# Patient Record
Sex: Male | Born: 1990 | Race: Black or African American | Hispanic: No | Marital: Single | Smoking: Current every day smoker
Health system: Southern US, Community
[De-identification: ages and names within clinical notes are randomized; demographics above are authoritative.]

## PROBLEM LIST (undated history)

## (undated) HISTORY — PX: ELBOW SURGERY: SHX618

---
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
## 2010-06-17 NOTE — Op Note (Signed)
Trevor Bradford, Trevor Bradford             ACCOUNT NO.:  [id number]   MEDICAL RECORD NO.:  [id number]          PATIENT TYPE:  AMB   LOCATION:  DAY                          FACILITY:  [facility]   PHYSICIAN:  Trevor Bradford, M.D.  DATE OF BIRTH:  Feb 16, 1990   DATE OF PROCEDURE:  10/19/2007  DATE OF DISCHARGE:  10/19/2007                               OPERATIVE REPORT   PREOPERATIVE DIAGNOSIS:  History of reduced right testicular torsion.   POSTOPERATIVE DIAGNOSIS:  History of reduced right testicular torsion.   PROCEDURE:  Bilateral scrotal orchiopexy.   ATTENDING PHYSICIAN:  Trevor Bradford, M.D.   RESIDENT PHYSICIAN:  Dr. Isabell Jarvis E. Trevor Bradford, M.D. and Trevor Chimes,  RN.   ANESTHESIA:  General plus local.   INDICATIONS FOR PROCEDURE:  Trevor Bradford is a 20 year old African American  male who was taken to an emergency department recently with complaint of  sudden onset of right testicular discomfort associated with nausea.  He  was found to have a firm, tender right testicle that appears as if it  was reduced or detorsion by an emergency department physician.  Doppler  ultrasound  demonstrated flow to the right testicle.  He was sent to Dr.  Isabel Bradford for further evaluation.  The risks, consequences, concerns and  complications were discussed with the patient and his family  preoperatively and informed consent was obtained.   PROCEDURE IN DETAIL:  The patient was brought to the operating room and  placed in the supine position.  He was correctly identified by his  wristband.  Appropriate time-out was taken.  IV antibiotics were  administered.  General anesthesia was delivered.  Once adequately  anesthetized, his groin was clipped, prepped and draped in normal  sterile fashion.  We palpated both testicles.  They were palpably  normal.  We then made a small midline incision through his scrotum  sharply and used blunt and sharp dissection through the fascial layers  and subsequently delivered the  patient's left testicle into the  operative field.  It was grossly normal in appearance.  A spermatic cord  block was placed using 0.25% plain Marcaine.  The left testicular  appendage was amputated with Bovie electrocautery.  There was an  associated bell clapper deformity appreciated, however.  We then used a  4-0 Ethibond suture to fixate his left testicle in the three-point  configuration medially, laterally and inferiorly to the dartos tissue.  As this was done proper lie and orientation of the testicle was verified  and in this included protection of the epididymis and cord structures.  We then turned our attention to his right side.  We again used blunt and  sharp dissection through the fascial layers to expose the right  testicle.  The right testicle was delivered into the field.  Again, the  testicle and epididymis appeared grossly normal, but bell clapper  deformity was noticed here as well.  A spermatic cord block was placed  using 0.25% plain Marcaine.  The right testicular appendage was  amputated with Bovie electrocautery.  We then put medial and lateral  sutures using 4-0 Ethibond in through  the mesorchium and fixating it to  the dartos fascia as we delivered the testicle right back into the  hemiscrotum.  We also put inferior suture in after the testicle was then  replaced back into the hemiscrotum.  We then closed the both hemi-scrota  by reapproximating the right side to the midline raphe to the left side  in a running fashion using 3-0 Vicryl.  The skin was then closed in  simple running fashion using 4-0 Vicryl.  The wound was re-cleaned and  re-dressed.  Palpation verified proper orientation of the testes.  We  then placed bacitracin over the incision and applied benzoin and a  Tegaderm.  This marked the end of our procedure.  He tolerated the  procedure well.  He awoke from anesthesia and was taken to the recovery  room in stable condition.  There were no  complications.  Trevor Bradford was  present and participated in all aspects of the case.     ______________________________  Trevor Bradford, M.D.      Trevor Bradford, M.D.  Electronically Signed    DEW/MEDQ  D:  10/19/2007  T:  10/20/2007  Job:  [id number]

## 2010-06-20 NOTE — Op Note (Signed)
NAMENOLLIE, TERLIZZI NO.:  [id number]   MEDICAL RECORD NO.:  [id number]          PATIENT TYPE:  OBV   LOCATION:  6120                         FACILITY:  [facility]   PHYSICIAN:  Dionne Ano. Gramig III, M.D.DATE OF BIRTH:  Jul 01, 1990   DATE OF PROCEDURE:  05/31/2004  DATE OF DISCHARGE:                                 OPERATIVE REPORT   PREOPERATIVE DIAGNOSIS:  Fracture dislocation, left elbow, after a wrestling  injury irreducible at present time, with medial epicondylar fragment  entrapped in the joint along with the ulnar nerve.   POSTOPERATIVE DIAGNOSIS:  Fracture dislocation, left elbow, after a  wrestling injury irreducible at present time, with medial epicondylar  fragment entrapped in the joint along with the ulnar nerve.   PROCEDURE:  1.  Open reduction and fixation, left elbow fracture dislocation.  This was      a medial epicondylar fracture ORIF and reduction of an irreducible      fracture dislocation.  2.  Stress radiography.  3.  Lateral ulnar collateral ligament repair back to the lateral aspect of      the elbow through a separate incision.  4.  Ulnar nerve decompression, left elbow, about the cubital tunnel.   SURGEON:  Dionne Ano. Amanda Pea, MD.   ASSISTANT:  Karie Chimera, PA-C.   COMPLICATIONS:  None.   ANESTHESIA:  General.   INDICATIONS FOR PROCEDURE:  This patient is a 20 year old male, who was  injured in a wrestling event today at approximately 3:30.  He was seen at  [facility].  The patient requested to come to [facility].  The ER staff talked with my partner, Dr. Ollen Gross, who asked me to take  over and preside over the care of this patient.  I discussed the care with  Dr. Lequita Halt as well as the ER staff at [facility] and immediately  arranged for a transfer to our facility.  The patient was transferred and  once in the [facility] Emergency Room I reviewed plain film radiographs and  obtained a CT scan to  document my suspicion of a medial epicondylar fracture  fragment entrapped in the joint with fracture dislocation of the elbow  irreducible in nature.  The patient was prepped for surgery immediately and  was taken to the operative suite.  Informed consent was obtained from the  family.  I discussed with them the risks of bleeding, infection, anesthesia,  neurovascular injury, stiffness, arthrofibrosis, dystrophy, and other  complicating features associated with elbow fracture dislocations including  heterotopic ossification and compartment syndrome with subsequent loss of  limb, etc.  I have discussed all issues at length and with this in mind they  decided to proceed.   OPERATIVE PROCEDURE:  This patient was taken to the operative suite and  underwent smooth induction of anesthesia.  He was given preoperative Ancef.  Following this, he underwent a Hibiclens scrub due to a SHELLFISH ALLERGY.  He tolerated the Ancef well and there were no complicating features.  He  weighs 110 pounds.  The patient was laid supine, appropriately padded, and  prepped and  draped in the usual sterile fashion.  I performed a gentle  manipulative reduction in hopes to dislodge the medial epicondylar fragment.  This was done by placing a valgus stress on the elbow and extending the  wrist in hopes to dislodge the medial epicondylar fragment.  This was  unsuccessful and I immediately inflated the tourniquet to 250 mmHg and made  an incision medially.  This was made over the medial epicondylar region.  Dissection was carried down and the medial antebrachial cutaneous nerve  branch was identified and protected at all times.  It was very obvious he  had a medial epicondylar fragment entrapped in the joint with a flexor  pronator mass.  The patient had a posterolateral dislocation associated with  this.  I identified the ulnar nerve, kept it very protected and following  this I dug portions of the flexor pronator mass  and medial epicondylar  fragment out of the joint.  I irrigated copiously and suctioned.  I then  performed an ulnar nerve release about the area in question.  The ulnar  nerve was released, a vessel loop was placed around it, and this included  the medial and muscular septum, cubital tunnel, and Osborne's ligament.  The  two heads of the FCU were also split.  The nerve was very gently handled at  all times and protected in a direct 4.0 loop magnification.  Once this was  done and I was able to get the medial epicondylar fragment out, it was quite  obvious that the patient had some enfolded lateral tissue including portions  of the brachialis and annular complex.  This was very unusual, but not  totally unsurprising given the very irreducible nature of the fracture  dislocation.  I did try, with gentle attempts, reduction once the medial  epicondylar fragment was dislodged from the joint.  However, due to the  interpose tissue laterally, this was unsuccessful.  I thus made an incision  laterally.  This was a posterolateral incision.  Dissection was carried  down, the anconeus was split, the lateral and collateral ligament injury was  noted, and I then kept the arm in pronation and gently was able to pull the  interpose muscular tissue away from the radial head.  This was primarily  brachial radialis tissue.  Portions of the capsule were also enfolded in the  joint.  I carefully released this, taking care to avoid neurovascular  structures, and then was able to reduce the elbow fracture dislocation.  Once this was reduced, I then turned attention towards the ulnar nerve, I  made sure it was completely released, and I left it in situ and protected  it.  At this point in time, I then performed a medial epicondyle ORIF with  0.062 K-wire, 0.045 K-wire, and two FiberWire sutures about a very thick  periosteal layer.  The patient tolerated this well.  I checked this under radiograph.  I was  pleased with the reduction, the position of the medial  epicondylar fragment, and per palpation, this appeared to be stable.  I bent  the pins at 90 degrees to protect the area and then placed them a bit more  further inward against the bone for later removal.  This will prevent  migration of the pins.  I bent this away from the ulnar nerve, of course, at  90 degrees.  Following ORIF of the medial epicondylar region and ulnar nerve  decompression, I then turned attention back towards the lateral side  and  performed a lateral ulnar collateral ligament repair with 4-0 FiberWire  suture.  This was gently tacked back to its previous position, and utilizing  periosteal tissue I repaired this ligament.  I was pleased with this.  I did  make a split in the anconeus for swelling purposes.  At this time, I  deflated the tourniquet at less than an hour and a half and irrigated  copiously about the joint and both incisions.  Radial pulse was excellent  and there was no evidence of compartment syndrome.  The compartments were  soft, he had excellent refill in the fingers, good pulse, and all went quite  well.  I then closed the subcu with Vicryl followed by a staple gun at the  skin edge.  The patient tolerated this well and there were no complicating  features.  He will be taken back to the operative suite in four to six weeks  for removal of the pins and evaluation.  I have discussed all issues with  his family.  I should note the ulnar nerve decompression was a distinct and  separate portion of the case outside of the general exposure due to its  entrapment in the joint.  The patient tolerated the procedure well.  He will  be monitored in the recovery room and then placed on the Pediatric floor.  We will continue IV antibiotics, and pain medicine according to his needs,  and general postop measures.  All questions have been encouraged and  answered.  At multiple points in the operative procedure,  stress radiography  was performed indicating correct placement of the pins and satisfactory  ORIF.  I was pleased with this and the reduction.  All looked very nice and  congruent.     WMG/MEDQ  D:  06/01/2004  T:  06/01/2004  Job:  [id number]   cc:   Attn:  Emergency Room staff [facility]

## 2010-11-05 LAB — GC/CHLAMYDIA PROBE AMP, GENITAL: GC Probe Amp, Genital: NEGATIVE

## 2015-05-05 ENCOUNTER — Emergency Department (INDEPENDENT_AMBULATORY_CARE_PROVIDER_SITE_OTHER)
Admission: EM | Admit: 2015-05-05 | Discharge: 2015-05-05 | Disposition: A | Discharge status: Home / Self Care | Payer: Federal, State, Local not specified - PPO | Source: Home / Self Care | Attending: Emergency Medicine | Admitting: Emergency Medicine

## 2015-05-05 ENCOUNTER — Encounter (HOSPITAL_COMMUNITY): Payer: Self-pay | Admitting: *Deleted

## 2015-05-05 DIAGNOSIS — R040 Epistaxis: Secondary | ICD-10-CM | POA: Diagnosis not present

## 2015-05-05 NOTE — ED Notes (Signed)
Assessment per F. Luisa HartPatrick, [place].  No active nosebleed.

## 2015-05-05 NOTE — ED Provider Notes (Signed)
CSN: [id number]     Arrival date & time 05/05/15  1825 History   First MD Initiated Contact with Patient 05/05/15 1841     Chief Complaint  Patient presents with  . Epistaxis   (Consider location/radiation/quality/duration/timing/severity/associated sxs/prior Treatment) HPI Bleeding from left nostril today, lasted about 15 minutes, rubbing nose when this started. No headache, or other symptoms. Similar nosebleed several months ago. No signigicant PMHx.  History reviewed. No pertinent past medical history. History reviewed. No pertinent past surgical history. No family history on file. Social History  Substance Use Topics  . Smoking status: None  . Smokeless tobacco: None  . Alcohol Use: None    Review of Systems Nose bleed  Allergies  Amoxicillin  Home Medications   Prior to Admission medications   Not on File   Meds Ordered and Administered this Visit  Medications - No data to display  BP 133/96 mmHg  Pulse 92  Temp(Src) 98.5 F (36.9 C) (Oral)  Resp 18  SpO2 100% No data found.   Physical Exam NURSES NOTES AND VITAL SIGNS REVIEWED. CONSTITUTIONAL: Well developed, well nourished, no acute distress HEENT: normocephalic, atraumatic, small dry area left kiesselbach plexus, no active bleeding EYES: Conjunctiva normal NECK:normal ROM, supple, no adenopathy PULMONARY:No respiratory distress, normal effort MUSCULOSKELETAL: Normal ROM of all extremities,  SKIN: warm and dry without rash PSYCHIATRIC: Mood and affect, behavior are normal  ED Course  Procedures (including critical care time)  Labs Review Labs Reviewed - No data to display  Imaging Review No results found.   Visual Acuity Review  Right Eye Distance:   Left Eye Distance:   Bilateral Distance:    Right Eye Near:   Left Eye Near:    Bilateral Near:         MDM   1. Bleeding nose     Patient is reassured that there are no issues that require transfer to higher level of care at this  time or additional tests. Patient is advised to continue home symptomatic treatment. Patient is advised that if there are new or worsening symptoms to attend the emergency department, contact primary care provider, or return to UC. Instructions of care provided discharged home in stable condition.    THIS NOTE WAS GENERATED USING A VOICE RECOGNITION SOFTWARE PROGRAM. ALL REASONABLE EFFORTS  WERE MADE TO PROOFREAD THIS DOCUMENT FOR ACCURACY.  I have verbally reviewed the discharge instructions with the patient. A printed AVS was given to the patient.  All questions were answered prior to discharge.      Tharon AquasFrank C Janai Brannigan, PA 05/05/15 [phone]

## 2015-05-05 NOTE — Discharge Instructions (Signed)

## 2015-05-07 ENCOUNTER — Encounter (HOSPITAL_COMMUNITY): Payer: Self-pay | Admitting: Emergency Medicine

## 2015-05-07 ENCOUNTER — Emergency Department (HOSPITAL_COMMUNITY)
Admission: EM | Admit: 2015-05-07 | Discharge: 2015-05-07 | Disposition: A | Discharge status: Home / Self Care | Payer: Federal, State, Local not specified - PPO | Attending: Emergency Medicine | Admitting: Emergency Medicine

## 2015-05-07 DIAGNOSIS — Z880 Allergy status to penicillin: Secondary | ICD-10-CM | POA: Insufficient documentation

## 2015-05-07 DIAGNOSIS — F17210 Nicotine dependence, cigarettes, uncomplicated: Secondary | ICD-10-CM | POA: Insufficient documentation

## 2015-05-07 DIAGNOSIS — J069 Acute upper respiratory infection, unspecified: Secondary | ICD-10-CM | POA: Diagnosis not present

## 2015-05-07 DIAGNOSIS — R05 Cough: Secondary | ICD-10-CM | POA: Diagnosis present

## 2015-05-07 DIAGNOSIS — R040 Epistaxis: Secondary | ICD-10-CM | POA: Insufficient documentation

## 2015-05-07 MED ORDER — BACITRACIN ZINC 500 UNIT/GM EX OINT: TOPICAL_OINTMENT | CUTANEOUS | Status: AC

## 2015-05-07 MED ORDER — GUAIFENESIN ER 600 MG PO TB12: 600.0000 mg | ORAL_TABLET | Freq: Two times a day (BID) | ORAL | Status: AC | PRN

## 2015-05-07 MED ORDER — OXYMETAZOLINE HCL 0.05 % NA SOLN: NASAL | Status: AC

## 2015-05-07 MED ORDER — BENZONATATE 100 MG PO CAPS
100.0000 mg | ORAL_CAPSULE | Freq: Three times a day (TID) | ORAL | Status: AC | PRN
Start: 2015-05-07 — End: ?

## 2015-05-07 NOTE — ED Provider Notes (Signed)
History  By signing my name below, I, Karle PlumberJennifer Tensley, attest that this documentation has been prepared under the direction and in the presence of Bush Murdoch, [place]-C. Electronically Signed: Karle PlumberJennifer Tensley, ED Scribe. 05/07/2015. 11:55 AM.  Chief Complaint  Patient presents with  . Epistaxis  . Cough   The history is provided by the patient and medical records. No language interpreter was used.    HPI Comments:  Trevor Bradford is a 25 y.o. male who presents to the Emergency Department complaining of intermittent epistaxis that began two days ago. Pt reports he was seen at UC two days ago and was told he had a sore in his nose. He reports he has a cough that exacerbates the epistaxis. He reports rhinorrhea and chills. He has not taken anything for his symptoms but reports squeezing the nose to stop the bleeding. He denies alleviating factors. He denies current active bleeding, fever, nausea, vomiting, body aches or difficulty breathing.  History reviewed. No pertinent past medical history. Past Surgical History  Procedure Laterality Date  . Elbow surgery     History reviewed. No pertinent family history. Social History  Substance Use Topics  . Smoking status: Current Every Day Smoker    Types: Cigars  . Smokeless tobacco: None     Comment: 1 Black & Mild per day  . Alcohol Use: Yes     Comment: occasional    Review of Systems A complete 10 system review of systems was obtained and all systems are negative except as noted in the HPI and PMH.   Allergies  Amoxicillin  Home Medications   Prior to Admission medications   Medication Sig Start Date End Date Taking? Authorizing Provider  bacitracin ointment Use a q-tip to apply ointment into both nostrils twice daily to keep nostrils moisturized. 05/07/15   Ace GinsSerena Y Baran Kuhrt, PA-C  benzonatate (TESSALON) 100 MG capsule Take 1 capsule (100 mg total) by mouth 3 (three) times daily as needed for cough. 05/07/15   Ace GinsSerena Y Oneal Schoenberger, PA-C   guaiFENesin (MUCINEX) 600 MG 12 hr tablet Take 1 tablet (600 mg total) by mouth 2 (two) times daily as needed for to loosen phlegm. 05/07/15   Ace GinsSerena Y Lorrin Nawrot, PA-C  oxymetazoline (AFRIN NASAL SPRAY) 0.05 % nasal spray Use 2 sprays in the bleeding nostril as needed. Continue squeezing your nose after using the spray. 05/07/15   Carlene CoriaSerena Y Argenis Kumari, PA-C   Triage Vitals: BP 116/76 mmHg  Pulse 100  Temp(Src) 98.3 F (36.8 C) (Oral)  Resp 18  SpO2 100% Physical Exam  Constitutional: He is oriented to person, place, and time. He appears well-developed and well-nourished.  HENT:  Head: Normocephalic and atraumatic.  Right Ear: External ear normal.  Left Ear: External ear normal.  Mouth/Throat: Oropharynx is clear and moist.  L anterior kesselbach's plexus dry, with some dried blood. Bilateral nares are dry in appearance. Turbinates otherwise unremarkable. No active bleeding.   Eyes: Conjunctivae and EOM are normal. Pupils are equal, round, and reactive to light.  Neck: Normal range of motion.  Cardiovascular: Normal rate, regular rhythm and normal heart sounds.   Pulmonary/Chest: Effort normal and breath sounds normal. He has no wheezes.  Musculoskeletal: Normal range of motion.  Lymphadenopathy:    He has no cervical adenopathy.  Neurological: He is alert and oriented to person, place, and time.  Skin: Skin is warm and dry.  Psychiatric: He has a normal mood and affect. His behavior is normal.  Nursing note and vitals reviewed.  ED Course  Procedures (including critical care time) DIAGNOSTIC STUDIES: Oxygen Saturation is 100% on RA, normal by my interpretation.   COORDINATION OF CARE: 11:54 AM- Will prescribe Afrin and symptomatic treatment of a viral infection. Pt verbalizes understanding and agrees to plan.  Medications - No data to display   MDM   Final diagnoses:  URI (upper respiratory infection)  Epistaxis    Nasal bleedling likely 2/2 dryness and irritation from URI and  weather changes. I suspect viral URI. Pt is not hypoxic or febrile. He has no adventitious lung sounds on exam. Will hold off on CXR today. I discussed with pt will trial conservative therapies for intermittent epistaxis. Encouraged vaseline or bacitracin in nares to help keep tissues moist. Afrin rx given for if pt has a recurrent episode of bleeding that is difficult to control with steady pressure alone. Verbally instructed to f/u with ENT but I forgot to put the contact info in AVS. ER return precautions given.   I personally performed the services described in this documentation, which was scribed in my presence. The recorded information has been reviewed and is accurate.     Carlene Coria, PA-C 05/07/15 1332  Raeford Razor, MD 05/09/15 [phone]

## 2015-05-07 NOTE — ED Notes (Signed)
Pt sts intermittent nose bleeding and cough with URI sx x 3 days

## 2015-05-07 NOTE — Discharge Instructions (Signed)
You likely have a viral upper respiratory infection that is irritating and drying your nose out. As we discussed you may use the Afrin nasal spray in the bleeding nostril to help control bleeding. Use a q-tip to apply the bacitracin ointment (or even regular vaseline) into each nostril twice a day to keep the inside of your nose moisturized. You may try the tessalon and mucinex to help with your cough/cold symptoms.   Take medications as prescribed. Return to the emergency room for worsening condition or new concerning symptoms. Follow up with your regular doctor. If you don't have a regular doctor use one of the numbers below to establish a primary care doctor.   Emergency Department Resource Guide 1) Find a Doctor and Pay Out of Pocket Although you won't have to find out who is covered by your insurance plan, it is a good idea to ask around and get recommendations. You will then need to call the office and see if the doctor you have chosen will accept you as a new patient and what types of options they offer for patients who are self-pay. Some doctors offer discounts or will set up payment plans for their patients who do not have insurance, but you will need to ask so you aren't surprised when you get to your appointment.  2) Contact Your Local Health Department Not all health departments have doctors that can see patients for sick visits, but many do, so it is worth a call to see if yours does. If you don't know where your local health department is, you can check in your phone book. The CDC also has a tool to help you locate your state's health department, and many state websites also have listings of all of their local health departments.  3) Find a Walk-in Clinic If your illness is not likely to be very severe or complicated, you may want to try a walk in clinic. These are popping up all over the country in pharmacies, drugstores, and shopping centers. They're usually staffed by nurse  practitioners or physician assistants that have been trained to treat common illnesses and complaints. They're usually fairly quick and inexpensive. However, if you have serious medical issues or chronic medical problems, these are probably not your best option.  No Primary Care Doctor: - Call Health Connect at  [phone] - they can help you locate a primary care doctor that  accepts your insurance, provides certain services, etc. - Physician Referral [phone]  Emergency Department Resource Guide 1) Find a Doctor and Pay Out of Pocket Although you won't have to find out who is covered by your insurance plan, it is a good idea to ask around and get recommendations. You will then need to call the office and see if the doctor you have chosen will accept you as a new patient and what types of options they offer for patients who are self-pay. Some doctors offer discounts or will set up payment plans for their patients who do not have insurance, but you will need to ask so you aren't surprised when you get to your appointment.  2) Contact Your Local Health Department Not all health departments have doctors that can see patients for sick visits, but many do, so it is worth a call to see if yours does. If you don't know where your local health department is, you can check in your phone book. The CDC also has a tool to help you locate your state's health department, and many  state websites also have listings of all of their local health departments.  3) Find a Walk-in Clinic If your illness is not likely to be very severe or complicated, you may want to try a walk in clinic. These are popping up all over the country in pharmacies, drugstores, and shopping centers. They're usually staffed by nurse practitioners or physician assistants that have been trained to treat common illnesses and complaints. They're usually fairly quick and inexpensive. However, if you have serious medical issues or chronic  medical problems, these are probably not your best option.  No Primary Care Doctor: - Call Health Connect at  [phone] - they can help you locate a primary care doctor that  accepts your insurance, provides certain services, etc. - Physician Referral Service- [phone]  Chronic Pain Problems: Organization         Address  Phone   Notes  [facility]  [phone] Patients need to be referred by their primary care doctor.   Medication Assistance: Organization         Address  Phone   Notes  [place] [facility] [address] [place], [place] [address] [phone]) [phone] --Must be a resident of [place] -- Must have NO insurance coverage whatsoever (no Medicaid/ Medicare, etc.) -- The pt. MUST have a primary care doctor that directs their care regularly and follows them in the community   [facility]  [phone]   [employer]  [phone]    Agencies that provide inexpensive medical care: Organization         Address  Phone   Notes  [facility]  [phone]   [facility]    [phone]   [facility] [address] [place], [place] [address] [phone]   [facility] [address], [place] [phone]   [facility]    [phone]   [facility]    [phone]   [facility]  [address], [place] Phone:  [phone](336) [phone], Fax:  [phone] Hours of Operation:  9 am - 6 pm, M-F.  Also accepts Medicaid/Medicare and self-pay.  [facility]  [address], [place] Phone: [phone], Fax: [phone](336) [phone]. Hours of Operation:  8:30 am - 5:30 pm, M-F.  Also accepts Medicaid and self-pay.  [facility] [place] [address], [place] Phone: [phone](336) [phone]   [facility] [address], [place], [place] [phone],  Ext. [phone] Mondays & Thursdays: 7-9 AM.  First 15 patients are seen on a first come, first serve basis.    Medicaid-accepting [facility] [place] Providers:  Organization         Address  Phone   Notes  [facility] [address], [place] [phone]) [phone] Also accepts self-pay patients.  [facility] [address], [place]  [phone]   [facility] [address], [place] [phone]   [facility] [address] [place] [address], [place] [phone]) [phone]   Renaye RakersVeita Bland [address], [place]   [phone] Only accepts [place] patients after they have their name applied to their card.   Self-Pay (no insurance) in [facility] [place] [facility]:  [employer]         Address  Phone   Notes  Sickle Cell Patients, [facility] [address] [place], [place] [phone]   [facility] [address] [place], [place] [phone]   [facility] [place]  [address], [place] [phone]   [facility]/Dr. Osei-Bonsu  [address] [place] or [address], [place] [phone] Phone number for both [place] and [place] locations is the same.  [facility] [address], [place] [phone]   [facility] [address] [place] or [address] [phone]   [facility] [address] [place] [phone], phone; [phone], fax Sees patients 1st and 3rd Saturday of every month.  Must not qualify for public or private insurance (i.e. Medicaid, Medicare, [place] Health Choice, Veterans' Benefits)  Household income should be no more than 200% of the poverty level The clinic cannot treat you if you are pregnant or think you are pregnant  Sexually transmitted diseases are not  treated at the clinic.

## 2018-08-31 ENCOUNTER — Other Ambulatory Visit: Payer: Self-pay

## 2018-08-31 DIAGNOSIS — Z20822 Contact with and (suspected) exposure to covid-19: Secondary | ICD-10-CM

## 2018-09-02 LAB — NOVEL CORONAVIRUS, NAA: SARS-CoV-2, NAA: NOT DETECTED
